# Patient Record
Sex: Female | Born: 1990 | Race: Black or African American | Hispanic: No | Marital: Single | Smoking: Former smoker
Health system: Southern US, Community
[De-identification: ages and names within clinical notes are randomized; demographics above are authoritative.]

## PROBLEM LIST (undated history)

## (undated) ENCOUNTER — Inpatient Hospital Stay (HOSPITAL_COMMUNITY): Payer: Self-pay

## (undated) HISTORY — PX: WRIST SURGERY: SHX841

---

## 2012-03-14 ENCOUNTER — Emergency Department (HOSPITAL_COMMUNITY)
Admission: EM | Admit: 2012-03-14 | Discharge: 2012-03-14 | Disposition: A | Discharge status: Home / Self Care | Payer: Self-pay | Attending: Emergency Medicine | Admitting: Emergency Medicine

## 2012-03-14 ENCOUNTER — Encounter (HOSPITAL_COMMUNITY): Payer: Self-pay | Admitting: *Deleted

## 2012-03-14 DIAGNOSIS — H6120 Impacted cerumen, unspecified ear: Secondary | ICD-10-CM | POA: Insufficient documentation

## 2012-03-14 MED ORDER — CARBAMIDE PEROXIDE 6.5 % OT SOLN: 5.0000 [drp] | Freq: Two times a day (BID) | OTIC | Status: DC

## 2012-03-14 NOTE — ED Notes (Signed)
Pt c/o left ear pain x's 2 days. Also reports "I can't hear" pt states she uses q-tips and noticed blood from left ear this morning.

## 2012-03-14 NOTE — ED Provider Notes (Signed)
History     CSN: [id number]  Arrival date & time 03/14/12  1038   First MD Initiated Contact with Patient 03/14/12 1041      Chief Complaint  Patient presents with  . Otalgia    (Consider location/radiation/quality/duration/timing/severity/associated sxs/prior treatment) HPI  Pt to the ER requesting pregnancy test and her "left ear to be looked at". She was trying to clean it and felt pain. She denies it continuing to hurt. Denies fevers. Denies sore throat, right ear pain, neck pain or any other symptoms at this time. nad vss  History reviewed. No pertinent past medical history.  Past Surgical History  Procedure Date  . Wrist surgery     History reviewed. No pertinent family history.  History  Substance Use Topics  . Smoking status: Not on file  . Smokeless tobacco: Not on file  . Alcohol Use: No    OB History    Grav Para Term Preterm Abortions TAB SAB Ect Mult Living                  Review of Systems  Review of Systems  Gen: no weight loss, fevers, chills, night sweats  Eyes: no discharge or drainage, no occular pain or visual changes  Nose: no epistaxis or rhinorrhea  Mouth: no dental pain, no sore throat  Neck: no neck pain  MSK:  No abnormalities     Allergies  Review of patient's allergies indicates no known allergies.  Home Medications   Current Outpatient Rx  Name  Route  Sig  Dispense  Refill  . OVER THE COUNTER MEDICATION   Oral   Take 1 tablet by mouth daily. Prenatal vitamin         . CARBAMIDE PEROXIDE 6.5 % OT SOLN   Left Ear   Place 5 drops into the left ear 2 (two) times daily.   15 mL   0     BP 125/84  Pulse 70  Temp 98.3 F (36.8 C) (Oral)  Resp 20  Ht 5\' 1"  (1.549 m)  Wt 110 lb (49.896 kg)  BMI 20.78 kg/m2  SpO2 100%  LMP 03/05/2012  Physical Exam  Nursing note and vitals reviewed. Constitutional: She appears well-developed and well-nourished. No distress.  HENT:  Head: Normocephalic and atraumatic.  Right  Ear: Tympanic membrane normal.  Ears:  Eyes: Pupils are equal, round, and reactive to light.  Neck: Normal range of motion. Neck supple.  Cardiovascular: Normal rate and regular rhythm.   Pulmonary/Chest: Effort normal.  Abdominal: Soft.  Neurological: She is alert.  Skin: Skin is warm and dry.    ED Course  Procedures (including critical care time)   Labs Reviewed  POCT PREGNANCY, URINE   No results found.   1. Impacted cerumen       MDM  Pregnancy test negative. Pt given Debrox and told to go to Urgent Care or see PCP.  Pt has been advised of the symptoms that warrant their return to the ED. Patient has voiced understanding and has agreed to follow-up with the PCP or specialist.         Dorthula Matas, PA 03/14/12 1156

## 2012-03-17 NOTE — ED Provider Notes (Signed)
Medical screening examination/treatment/procedure(s) were performed by non-physician practitioner and as supervising physician I was immediately available for consultation/collaboration.   Laray Anger, DO 03/17/12 2018

## 2016-11-14 ENCOUNTER — Encounter (HOSPITAL_COMMUNITY): Payer: Self-pay

## 2016-11-14 ENCOUNTER — Inpatient Hospital Stay (HOSPITAL_COMMUNITY)
Admission: EM | Admit: 2016-11-14 | Discharge: 2016-11-14 | Disposition: A | Discharge status: Home / Self Care | Payer: BLUE CROSS/BLUE SHIELD | Attending: Obstetrics and Gynecology | Admitting: Obstetrics and Gynecology

## 2016-11-14 ENCOUNTER — Inpatient Hospital Stay (HOSPITAL_COMMUNITY): Payer: BLUE CROSS/BLUE SHIELD

## 2016-11-14 DIAGNOSIS — O209 Hemorrhage in early pregnancy, unspecified: Secondary | ICD-10-CM

## 2016-11-14 DIAGNOSIS — O039 Complete or unspecified spontaneous abortion without complication: Secondary | ICD-10-CM | POA: Diagnosis present

## 2016-11-14 DIAGNOSIS — N939 Abnormal uterine and vaginal bleeding, unspecified: Secondary | ICD-10-CM | POA: Diagnosis present

## 2016-11-14 DIAGNOSIS — O034 Incomplete spontaneous abortion without complication: Secondary | ICD-10-CM

## 2016-11-14 LAB — CBC WITH DIFFERENTIAL/PLATELET
BASOS ABS: 0 10*3/uL (ref 0.0–0.1)
Basophils Relative: 0 %
Eosinophils Absolute: 0.1 10*3/uL (ref 0.0–0.7)
Eosinophils Relative: 1 %
HCT: 32.1 % — ABNORMAL LOW (ref 36.0–46.0)
HEMOGLOBIN: 10.6 g/dL — AB (ref 12.0–15.0)
LYMPHS PCT: 18 %
Lymphs Abs: 2.4 10*3/uL (ref 0.7–4.0)
MCH: 27.5 pg (ref 26.0–34.0)
MCHC: 33 g/dL (ref 30.0–36.0)
MCV: 83.4 fL (ref 78.0–100.0)
Monocytes Absolute: 1.1 10*3/uL — ABNORMAL HIGH (ref 0.1–1.0)
Monocytes Relative: 8 %
Neutro Abs: 9.6 10*3/uL — ABNORMAL HIGH (ref 1.7–7.7)
Neutrophils Relative %: 73 %
Platelets: 280 10*3/uL (ref 150–400)
RBC: 3.85 MIL/uL — ABNORMAL LOW (ref 3.87–5.11)
RDW: 14.7 % (ref 11.5–15.5)
WBC: 13.2 10*3/uL — AB (ref 4.0–10.5)

## 2016-11-14 LAB — BASIC METABOLIC PANEL
ANION GAP: 8 (ref 5–15)
BUN: 13 mg/dL (ref 6–20)
CHLORIDE: 107 mmol/L (ref 101–111)
CO2: 24 mmol/L (ref 22–32)
Calcium: 9.5 mg/dL (ref 8.9–10.3)
Creatinine, Ser: 0.71 mg/dL (ref 0.44–1.00)
GFR calc Af Amer: >60 mL/min (ref >60)
GLUCOSE: 128 mg/dL — AB (ref 65–99)
POTASSIUM: 4 mmol/L (ref 3.5–5.1)
Sodium: 139 mmol/L (ref 135–145)

## 2016-11-14 LAB — HCG, QUANTITATIVE, PREGNANCY: hCG, Beta Chain, Quant, S: 5112 m[IU]/mL — ABNORMAL HIGH (ref <5)

## 2016-11-14 LAB — CBC
HCT: 29.9 % — ABNORMAL LOW (ref 36.0–46.0)
Hemoglobin: 9.9 g/dL — ABNORMAL LOW (ref 12.0–15.0)
MCH: 27.6 pg (ref 26.0–34.0)
MCHC: 33.1 g/dL (ref 30.0–36.0)
MCV: 83.3 fL (ref 78.0–100.0)
PLATELETS: 245 10*3/uL (ref 150–400)
RBC: 3.59 MIL/uL — AB (ref 3.87–5.11)
RDW: 15.2 % (ref 11.5–15.5)
WBC: 15.7 10*3/uL — ABNORMAL HIGH (ref 4.0–10.5)

## 2016-11-14 LAB — APTT: aPTT: 28 seconds (ref 24–36)

## 2016-11-14 LAB — PREPARE RBC (CROSSMATCH)

## 2016-11-14 LAB — ABO/RH: ABO/RH(D): O POS

## 2016-11-14 LAB — PROTIME-INR
INR: 1.09
Prothrombin Time: 14.1 seconds (ref 11.4–15.2)

## 2016-11-14 MED ORDER — SODIUM CHLORIDE 0.9 % IV BOLUS (SEPSIS)
1000.0000 mL | Freq: Once | INTRAVENOUS | Status: AC
  Administered 2016-11-14: 1000 mL via INTRAVENOUS

## 2016-11-14 MED ORDER — IBUPROFEN 600 MG PO TABS: 600.0000 mg | ORAL_TABLET | Freq: Four times a day (QID) | ORAL | 0 refills | Status: AC | PRN

## 2016-11-14 MED ORDER — SODIUM CHLORIDE 0.9 % IV SOLN
INTRAVENOUS | Status: DC
  Administered 2016-11-14: 09:00:00 via INTRAVENOUS

## 2016-11-14 MED ORDER — MORPHINE SULFATE (PF) 4 MG/ML IV SOLN
4.0000 mg | Freq: Once | INTRAVENOUS | Status: AC
  Administered 2016-11-14: 4 mg via INTRAVENOUS
  Filled 2016-11-14: qty 1

## 2016-11-14 MED ORDER — MISOPROSTOL 200 MCG PO TABS
800.0000 ug | ORAL_TABLET | Freq: Once | ORAL | Status: AC
  Administered 2016-11-14: 800 ug via ORAL
  Filled 2016-11-14: qty 4

## 2016-11-14 MED ORDER — OXYCODONE-ACETAMINOPHEN 5-325 MG PO TABS: 1.0000 | ORAL_TABLET | ORAL | 0 refills | Status: AC | PRN

## 2016-11-14 NOTE — ED Notes (Addendum)
Pt alert and oriented.  Denies any needs or complaints at this time.  Significant other at bedside.  Verified with Dr. Manus Gunningancour no transfusion at this time.

## 2016-11-14 NOTE — ED Triage Notes (Signed)
Pt reports vaginal bleeding that started yesterday with lower abdominal pain. Pt reports thinking she is having miscarriage. Pt last saw OB/GYN a couple weeks ago, where US was performed.

## 2016-11-14 NOTE — ED Provider Notes (Signed)
[facility]-EMERGENCY DEPT Provider Note   CSN: [id number] Arrival date & time: 11/14/16  0408     History   Chief Complaint Chief Complaint  Patient presents with  . vaginal bleeding/[redacted] weeks pregnant    HPI Cynthia Caldwell is a 26 y.o. female.  Patient presents with pelvic pain and vaginal bleeding. She reports that she believes she is having a miscarriage. She reports that she is about [redacted] weeks pregnant but she also states her last menstrual cycle was in March. She is G2 P1. No previous issues with prior pregnancy. Reports seeing an OB doctor in [place] and have an ultrasound. she states she developed vaginal bleeding last night which became more severe this morning. She has gone through 3 pads this morning. She also has lower pelvic cramping. No back pain. No fever. No vomiting. No pain with urination or blood in the urine. She does not know her blood type. Denies any dizziness or lightheadedness.   The history is provided by the patient.    History reviewed. No pertinent past medical history.  There are no active problems to display for this patient.   Past Surgical History:  Procedure Laterality Date  . CESAREAN SECTION    . WRIST SURGERY      OB History    Gravida Para Term Preterm AB Living   2 1 1     1    SAB TAB Ectopic Multiple Live Births                   Home Medications    Prior to Admission medications   Medication Sig Start Date End Date Taking? Authorizing Provider  carbamide peroxide (DEBROX) 6.5 % otic solution Place 5 drops into the left ear 2 (two) times daily. 03/14/12   Marlon Pel, PA-C  OVER THE COUNTER MEDICATION Take 1 tablet by mouth daily. Prenatal vitamin    [provider]    Family History No family history on file.  Social History Social History  Substance Use Topics  . Smoking status: Former Smoker    Years: 5.00  . Smokeless tobacco: Never Used  . Alcohol use No     Allergies   Patient has no known  allergies.   Review of Systems Review of Systems  Constitutional: Negative for activity change, appetite change and fever.  Respiratory: Negative for cough, chest tightness and shortness of breath.   Gastrointestinal: Positive for abdominal pain. Negative for nausea and vomiting.  Genitourinary: Positive for pelvic pain and vaginal bleeding. Negative for dysuria, flank pain and hematuria.  Musculoskeletal: Negative for arthralgias, back pain, gait problem and myalgias.  Neurological: Negative for dizziness, weakness, light-headedness and headaches.    all other systems are negative except as noted in the HPI and PMH.    Physical Exam Updated Vital Signs BP (!) 132/93   Pulse 90   Temp 97.7 F (36.5 C) (Oral)   Resp 20   Ht 5\' 1"  (1.549 m)   LMP 07/21/2016 (Approximate)   SpO2 100%   Physical Exam  Constitutional: She is oriented to person, place, and time. She appears well-developed and well-nourished. No distress.  HENT:  Head: Normocephalic and atraumatic.  Mouth/Throat: Oropharynx is clear and moist. No oropharyngeal exudate.  Eyes: Conjunctivae and EOM are normal. Pupils are equal, round, and reactive to light.  Neck: Normal range of motion. Neck supple.  No meningismus.  Cardiovascular: Normal rate, regular rhythm, normal heart sounds and intact distal pulses.   No murmur  heard. Pulmonary/Chest: Effort normal and breath sounds normal. No respiratory distress.  Abdominal: Soft. There is tenderness. There is no rebound and no guarding.  Gravid abdomen.  Suprapubic tenderness  Genitourinary:  Genitourinary Comments: Chaperone present. Normal external genitalia. Large amount of blood and clot in the vagina. Cervix dilated about 2 cm. Central uterine tenderness, no lateralizing adnexal tenderness  Musculoskeletal: Normal range of motion. She exhibits no edema or tenderness.  Neurological: She is alert and oriented to person, place, and time. No cranial nerve deficit. She  exhibits normal muscle tone. Coordination normal.   5/5 strength throughout. CN 2-12 intact.Equal grip strength.   Skin: Skin is warm.  Psychiatric: She has a normal mood and affect. Her behavior is normal.  Nursing note and vitals reviewed.    ED Treatments / Results  Labs (all labs ordered are listed, but only abnormal results are displayed) Labs Reviewed  CBC WITH DIFFERENTIAL/PLATELET - Abnormal; Notable for the following:       Result Value   WBC 13.2 (*)    RBC 3.85 (*)    Hemoglobin 10.6 (*)    HCT 32.1 (*)    Neutro Abs 9.6 (*)    Monocytes Absolute 1.1 (*)    All other components within normal limits  BASIC METABOLIC PANEL - Abnormal; Notable for the following:    Glucose, Bld 128 (*)    All other components within normal limits  HCG, QUANTITATIVE, PREGNANCY - Abnormal; Notable for the following:    hCG, Beta Chain, Quant, S 5,112 (*)    All other components within normal limits  PROTIME-INR  APTT  URINALYSIS, ROUTINE W REFLEX MICROSCOPIC  TYPE AND SCREEN  PREPARE RBC (CROSSMATCH)  ABO/RH    EKG  EKG Interpretation None       Radiology No results found.  Procedures Procedures (including critical care time)  Medications Ordered in ED Medications - No data to display   Initial Impression / Assessment and Plan / ED Course  I have reviewed the triage vital signs and the nursing notes.  Pertinent labs & imaging results that were available during my care of the patient were reviewed by me and considered in my medical decision making (see chart for details).    Patient with vaginal bleeding and suspicion for miscarriage. Pelvic pain.   Per medical record, she had ultrasound done at Dr. Ophelia Charter office on May 18. "On [place], only small GS seen. No YS/FP".  IV access established, blood work obtained, pelvic exam performed.  Patient with large amount of clot on pelvic exam with abnormal uterus on ultrasound. No IUP seen.  Discussed with nurse for Dr. Chestine Spore  who is in delivery.  Patient had near syncopal episode while attending to stand up. She is given IV fluids. Blood work sent including type and screen.  Blood pressure has improved with fluids and she is no longer symptomatic. Heart rate is in the 70s and 80s. Hemoglobin 10.6 without comparison.  Discussed with Dr. Chestine Spore of OB. She agrees with transfer to the MAU for evaluation for heavy bleeding and near syncope. Recommends withholding blood transfusion at this time unless she becomes hypotensive again.  Pressure 110/76. Heart rate 89. Patient is awake and alert and stable for transfer.   EMERGENCY DEPARTMENT [place] PREGNANCY "Study: Limited Ultrasound of the Pelvis for Pregnancy"  INDICATIONS:Pregnancy(required), Vaginal bleeding and Abdominal or pelvic pain Multiple views of the uterus and pelvic cavity were obtained in real-time with a multi-frequency probe.  APPROACH:Transabdominal  PERFORMED BY:  Myself IMAGES ARCHIVED?: Yes LIMITATIONS: Emergent procedure and pain PREGNANCY FREE FLUID: None ADNEXAL FINDINGS:Left ovary not seen and Right ovary not seen GESTATIONAL AGE, ESTIMATE:  FETAL HEART RATE:  INTERPRETATION: no IUP seen.  Uterus full of clot and blood.   CRITICAL CARE Performed by: Glynn OctaveANCOUR, Yajahira Tison Total critical care time: 45 minutes Critical care time was exclusive of separately billable procedures and treating other patients. Critical care was necessary to treat or prevent imminent or life-threatening deterioration. Critical care was time spent personally by me on the following activities: development of treatment plan with patient and/or surrogate as well as nursing, discussions with consultants, evaluation of patient's response to treatment, examination of patient, obtaining history from patient or surrogate, ordering and performing treatments and interventions, ordering and review of laboratory studies, ordering and review of radiographic studies, pulse oximetry and  re-evaluation of patient's condition.    Final Clinical Impressions(s) / ED Diagnoses   Final diagnoses:  Inevitable abortion  Vaginal bleeding    New Prescriptions New Prescriptions   No medications on file     Glynn Octaveancour, Lashauna Arpin, MD 11/14/16 [phone]

## 2016-11-14 NOTE — ED Notes (Signed)
Pt ambulatory to bathroom, given peri-pad, and urine cup.

## 2016-11-14 NOTE — MAU Note (Signed)
Pt. States that she started bleeding yesterday a small amount and woke up this morning at 3 am having a larger amount of bleeding with quarter sized clots. Pt. Has had pain and cramping in lower abdominal area.

## 2016-11-14 NOTE — ED Notes (Signed)
Report given to Justin with [facility] 

## 2016-11-14 NOTE — MAU Provider Note (Signed)
History     CSN: [id number]  Arrival date and time: 11/14/16 0825   First Provider Initiated Contact with Patient 11/14/16 1122      Chief Complaint  Patient presents with  . vaginal bleeding/[redacted] weeks pregnant   HPI   Ms.Cynthia Caldwell is a 26 y.o. female G2P1001 here in MAU as a transfer from [facility] for possible D&C per Dr. Clark. She presented to [facility] with abdominal pain and vaginal bleeding. She presented there early this morning with concerns about miscarriage. Says her LMP was in March and she believes she is about [redacted] weeks pregnant. She had one visit with Dr. Clark at [facility] and did not go back for follow up. The bleeding started last night and became heavy like a menstrual cycle this morning.   OB History    Gravida Para Term Preterm AB Living   2 1 1     1   SAB TAB Ectopic Multiple Live Births                  History reviewed. No pertinent past medical history.  Past Surgical History:  Procedure Laterality Date  . CESAREAN SECTION    . WRIST SURGERY      History reviewed. No pertinent family history.  Social History  Substance Use Topics  . Smoking status: Former Smoker    Years: 5.00  . Smokeless tobacco: Never Used  . Alcohol use No    Allergies: No Known Allergies  Prescriptions Prior to Admission  Medication Sig Dispense Refill Last Dose  . Prenatal Vit-Fe Fumarate-FA (PRENATAL MULTIVITAMIN) TABS tablet Take 1 tablet by mouth daily at 12 noon.   11/13/2016 at Unknown time   Results for orders placed or performed during the hospital encounter of 11/14/16 (from the past 48 hour(s))  CBC with Differential/Platelet     Status: Abnormal   Collection Time: 11/14/16  6:07 AM  Result Value Ref Range   WBC 13.2 (H) 4.0 - 10.5 K/uL   RBC 3.85 (L) 3.87 - 5.11 MIL/uL   Hemoglobin 10.6 (L) 12.0 - 15.0 g/dL   HCT 32.1 (L) 36.0 - 46.0 %   MCV 83.4 78.0 - 100.0 fL   MCH 27.5 26.0 - 34.0 pg   MCHC 33.0 30.0 - 36.0 g/dL   RDW  14.7 11.5 - 15.5 %   Platelets 280 150 - 400 K/uL   Neutrophils Relative % 73 %   Neutro Abs 9.6 (H) 1.7 - 7.7 K/uL   Lymphocytes Relative 18 %   Lymphs Abs 2.4 0.7 - 4.0 K/uL   Monocytes Relative 8 %   Monocytes Absolute 1.1 (H) 0.1 - 1.0 K/uL   Eosinophils Relative 1 %   Eosinophils Absolute 0.1 0.0 - 0.7 K/uL   Basophils Relative 0 %   Basophils Absolute 0.0 0.0 - 0.1 K/uL  Basic metabolic panel     Status: Abnormal   Collection Time: 11/14/16  6:07 AM  Result Value Ref Range   Sodium 139 135 - 145 mmol/L   Potassium 4.0 3.5 - 5.1 mmol/L   Chloride 107 101 - 111 mmol/L   CO2 24 22 - 32 mmol/L   Glucose, Bld 128 (H) 65 - 99 mg/dL   BUN 13 6 - 20 mg/dL   Creatinine, Ser 0.71 0.44 - 1.00 mg/dL   Calcium 9.5 8.9 - 10.3 mg/dL   GFR calc non Af Amer >60 >60 mL/min   GFR calc Af Amer >60 >  60 mL/min    Comment: (NOTE) The eGFR has been calculated using the CKD EPI equation. This calculation has not been validated in all clinical situations. eGFR's persistently <60 mL/min signify possible Chronic Kidney Disease.    Anion gap 8 5 - 15  hCG, quantitative, pregnancy     Status: Abnormal   Collection Time: 11/14/16  6:08 AM  Result Value Ref Range   hCG, Beta Chain, Quant, S 5,112 (H) <5 mIU/mL    Comment:          GEST. AGE      CONC.  (mIU/mL)   <=1 WEEK        5 - 50     2 WEEKS       50 - 500     3 WEEKS       100 - 10,000     4 WEEKS     1,000 - 30,000     5 WEEKS     3,500 - 115,000   6-8 WEEKS     12,000 - 270,000    12 WEEKS     15,000 - 220,000        FEMALE AND NON-PREGNANT FEMALE:     LESS THAN 5 mIU/mL   Type and screen [facility]     Status: None (Preliminary result)   Collection Time: 11/14/16  6:08 AM  Result Value Ref Range   ABO/RH(D) O POS    Antibody Screen NEG    Sample Expiration 11/17/2016    Unit Number [id number]    Blood Component Type RED CELLS,LR    Unit division 00    Status of Unit ALLOCATED    Transfusion Status OK TO TRANSFUSE     Crossmatch Result Compatible    Unit Number [id number]    Blood Component Type RBC LR PHER1    Unit division 00    Status of Unit ALLOCATED    Transfusion Status OK TO TRANSFUSE    Crossmatch Result Compatible   Protime-INR     Status: None   Collection Time: 11/14/16  6:08 AM  Result Value Ref Range   Prothrombin Time 14.1 11.4 - 15.2 seconds   INR 1.09   APTT     Status: None   Collection Time: 11/14/16  6:08 AM  Result Value Ref Range   aPTT 28 24 - 36 seconds  Prepare RBC     Status: None   Collection Time: 11/14/16  6:08 AM  Result Value Ref Range   Order Confirmation ORDER PROCESSED BY BLOOD BANK   ABO/Rh     Status: None   Collection Time: 11/14/16  6:08 AM  Result Value Ref Range   ABO/RH(D) O POS   CBC     Status: Abnormal   Collection Time: 11/14/16  9:23 AM  Result Value Ref Range   WBC 15.7 (H) 4.0 - 10.5 K/uL   RBC 3.59 (L) 3.87 - 5.11 MIL/uL   Hemoglobin 9.9 (L) 12.0 - 15.0 g/dL   HCT 29.9 (L) 36.0 - 46.0 %   MCV 83.3 78.0 - 100.0 fL   MCH 27.6 26.0 - 34.0 pg   MCHC 33.1 30.0 - 36.0 g/dL   RDW 15.2 11.5 - 15.5 %   Platelets 245 150 - 400 K/uL    US Ob Comp Less 14 Wks  Result Date: 11/14/2016 CLINICAL DATA:  Vaginal bleeding for 1 day EXAM: OBSTETRIC <14 WK [place] AND TRANSVAGINAL OB US TECHNIQUE: Both transabdominal and transvaginal  ultrasound examinations were performed for complete evaluation of the gestation as well as the maternal uterus, adnexal regions, and pelvic cul-de-sac. Transvaginal technique was performed to assess early pregnancy. COMPARISON:  None. FINDINGS: Intrauterine gestational sac: None visualized Yolk sac:  Not visualized Embryo:  Not visualized Cardiac Activity: Heart Rate:   bpm MSD:   mm    w     d CRL:    mm    w    d                  US EDC: Subchorionic hemorrhage:  None visualized. Maternal uterus/adnexae: No adnexal mass or free fluid. Heterogeneous, complex fluid or appearance of the endometrium, likely blood products in the lower  uterine segment/ cervical region. Endometrium measures 12 mm. IMPRESSION: No intrauterine pregnancy visualized. There is complex appearance of the endometrium in the lower uterine segment/cervical region which may be related to blood products. Electronically Signed   By: Kevin  Dover M.D.   On: 11/14/2016 10:18   Us Ob Transvaginal  Result Date: 11/14/2016 CLINICAL DATA:  Vaginal bleeding for 1 day EXAM: OBSTETRIC <14 WK US AND TRANSVAGINAL OB US TECHNIQUE: Both transabdominal and transvaginal ultrasound examinations were performed for complete evaluation of the gestation as well as the maternal uterus, adnexal regions, and pelvic cul-de-sac. Transvaginal technique was performed to assess early pregnancy. COMPARISON:  None. FINDINGS: Intrauterine gestational sac: None visualized Yolk sac:  Not visualized Embryo:  Not visualized Cardiac Activity: Heart Rate:   bpm MSD:   mm    w     d CRL:    mm    w    d                  US EDC: Subchorionic hemorrhage:  None visualized. Maternal uterus/adnexae: No adnexal mass or free fluid. Heterogeneous, complex fluid or appearance of the endometrium, likely blood products in the lower uterine segment/ cervical region. Endometrium measures 12 mm. IMPRESSION: No intrauterine pregnancy visualized. There is complex appearance of the endometrium in the lower uterine segment/cervical region which may be related to blood products. Electronically Signed   By: Kevin  Dover M.D.   On: 11/14/2016 10:18   Review of Systems  Gastrointestinal: Negative for abdominal pain.  Genitourinary: Positive for vaginal bleeding.  Neurological: Negative for dizziness and weakness.   Physical Exam   Blood pressure 109/86, pulse 82, temperature 98.4 F (36.9 C), temperature source Oral, resp. rate 18, height 5' 1" (1.549 m), last menstrual period 07/17/2016, SpO2 100 %.  Physical Exam  Constitutional: She is oriented to person, place, and time. She appears well-developed and well-nourished.  No distress.  HENT:  Head: Normocephalic.  Eyes: Pupils are equal, round, and reactive to light.  Respiratory: Effort normal.  Genitourinary:  Genitourinary Comments: Vagina - Small amount of dark red blood noted in the  vaginal canal. 2 large swabs used  Cervix - +active bleeding; small trickle  Bimanual exam: Cervix closed Uterus non tender, enlarged  Adnexa non tender, no masses bilaterally Chaperone present for exam.   Musculoskeletal: Normal range of motion.  Neurological: She is alert and oriented to person, place, and time.  Skin: Skin is warm and dry. She is not diaphoretic. There is pallor.  Psychiatric: Her behavior is normal.    MAU Course  Procedures  None   MDM  Hgb @ 0600: 10.6 Hgb @ 0900: 9.9 Pain 0/10 @ 11:15 O positive blood type. Orthostatic vitals WNL US showing complex   appearance in lower uterine segment Pelvic exam for reevaluation.  Discussed patient and exam with Dr. Harrington Challenger.  Patient stable at this time. Vaginal bleeding minimal. Patient up without dizziness. D&C not imperative at this time. Discussed D&C Vs Cytotec with patient who strongly urges to avoid surgery. Patient agreeable to oral Cytotec with close follow up in the office in 1 week.  Cytotec 800 mg given Buccal here in MAU. Patient tolerated well.   1250: reevaluated patient prior to DC home. Bleeding minimal. Small, quarter size on pad. Stable to DC home.   Assessment and Plan   A:  1. Inevitable abortion   2. Vaginal bleeding in pregnancy, first trimester   3. SAB (spontaneous abortion)     P:  Discharge home in stable condition Call the office today to schedule a follow up Quant and reevaluation in 1 week Bleeding precautions Strict return precautions Discussed the purpose of Cytotec, what to expect. Reasons to return Rx: Percocet, Ibuprofen Support given   Noni Saupe I, NP 11/14/2016 3:03 PM

## 2016-11-14 NOTE — Discharge Instructions (Signed)
Miscarriage A miscarriage is the sudden loss of an unborn baby (fetus) before the 20th week of pregnancy. Most miscarriages happen in the first 3 months of pregnancy. Sometimes, it happens before a woman even knows she is pregnant. A miscarriage is also called a "spontaneous miscarriage" or "early pregnancy loss." Having a miscarriage can be an emotional experience. Talk with your caregiver about any questions you may have about miscarrying, the grieving process, and your future pregnancy plans. What are the causes?  Problems with the fetal chromosomes that make it impossible for the baby to develop normally. Problems with the baby's genes or chromosomes are most often the result of errors that occur, by chance, as the embryo divides and grows. The problems are not inherited from the parents.  Infection of the cervix or uterus.  Hormone problems.  Problems with the cervix, such as having an incompetent cervix. This is when the tissue in the cervix is not strong enough to hold the pregnancy.  Problems with the uterus, such as an abnormally shaped uterus, uterine fibroids, or congenital abnormalities.  Certain medical conditions.  Smoking, drinking alcohol, or taking illegal drugs.  Trauma. Often, the cause of a miscarriage is unknown. What are the signs or symptoms?  Vaginal bleeding or spotting, with or without cramps or pain.  Pain or cramping in the abdomen or lower back.  Passing fluid, tissue, or blood clots from the vagina. How is this diagnosed? Your caregiver will perform a physical exam. You may also have an ultrasound to confirm the miscarriage. Blood or urine tests may also be ordered. How is this treated?  Sometimes, treatment is not necessary if you naturally pass all the fetal tissue that was in the uterus. If some of the fetus or placenta remains in the body (incomplete miscarriage), tissue left behind may become infected and must be removed. Usually, a dilation and  curettage (D and C) procedure is performed. During a D and C procedure, the cervix is widened (dilated) and any remaining fetal or placental tissue is gently removed from the uterus.  Antibiotic medicines are prescribed if there is an infection. Other medicines may be given to reduce the size of the uterus (contract) if there is a lot of bleeding.  If you have Rh negative blood and your baby was Rh positive, you will need a Rh immunoglobulin shot. This shot will protect any future baby from having Rh blood problems in future pregnancies. Follow these instructions at home:  Your caregiver may order bed rest or may allow you to continue light activity. Resume activity as directed by your caregiver.  Have someone help with home and family responsibilities during this time.  Keep track of the number of sanitary pads you use each day and how soaked (saturated) they are. Write down this information.  Do not use tampons. Do not douche or have sexual intercourse until approved by your caregiver.  Only take over-the-counter or prescription medicines for pain or discomfort as directed by your caregiver.  Do not take aspirin. Aspirin can cause bleeding.  Keep all follow-up appointments with your caregiver.  If you or your partner have problems with grieving, talk to your caregiver or seek counseling to help cope with the pregnancy loss. Allow enough time to grieve before trying to get pregnant again. Get help right away if:  You have severe cramps or pain in your back or abdomen.  You have a fever.  You pass large blood clots (walnut-sized or larger) ortissue from your  vagina. Save any tissue for your caregiver to inspect. °· Your bleeding increases. °· You have a thick, bad-smelling vaginal discharge. °· You become lightheaded, weak, or you faint. °· You have chills. °This information is not intended to replace advice given to you by your health care provider. Make sure you discuss any questions  you have with your health care provider. °Document Released: 10/19/2000 Document Revised: 10/01/2015 Document Reviewed: 06/14/2011 °Elsevier Interactive Patient Education © 2017 [employer] ° ° °Pelvic Rest °Pelvic rest may be recommended if: °· Your placenta is partially or completely covering the opening of your cervix (placenta previa). °· There is bleeding between the wall of the uterus and the amniotic sac in the first trimester of pregnancy (subchorionic hemorrhage). °· You went into labor too early (preterm labor). ° °Based on your overall health and the health of your baby, your health care provider will decide if pelvic rest is right for you. °How do I rest my pelvis? °For as long as told by your health care provider: °· Do not have sex, sexual stimulation, or an orgasm. °· Do not use tampons. Do not douche. Do not put anything in your vagina. °· Do not lift anything that is heavier than 10 lb (4.5 kg). °· Avoid activities that take a lot of effort (are strenuous). °· Avoid any activity in which your pelvic muscles could become strained. ° °When should I seek medical care? °Seek medical care if you have: °· Cramping pain in your lower abdomen. °· Vaginal discharge. °· A low, dull backache. °· Regular contractions. °· Uterine tightening. ° °When should I seek immediate medical care? °Seek immediate medical care if: °· You have vaginal bleeding and you are pregnant. ° °This information is not intended to replace advice given to you by your health care provider. Make sure you discuss any questions you have with your health care provider. °Document Released: 08/20/2010 Document Revised: 10/01/2015 Document Reviewed: 10/27/2014 °Elsevier Interactive Patient Education © 2018 [employer] ° °

## 2016-11-17 LAB — BPAM RBC
Blood Product Expiration Date: 201807282359
Blood Product Expiration Date: 201808082359
UNIT TYPE AND RH: 5100
UNIT TYPE AND RH: 9500

## 2016-11-17 LAB — TYPE AND SCREEN
ABO/RH(D): O POS
Antibody Screen: NEGATIVE
UNIT DIVISION: 0
Unit division: 0

## 2017-07-16 ENCOUNTER — Encounter (HOSPITAL_COMMUNITY): Payer: Self-pay

## 2018-11-10 IMAGING — US US OB COMP LESS 14 WK
1 series · 15 of 28 positions shown · non-contrast
Comparison: None.

CLINICAL DATA: Vaginal bleeding for 1 day

EXAM:
OBSTETRIC <14 WK US AND TRANSVAGINAL OB US
TECHNIQUE: Both transabdominal and transvaginal ultrasound examinations were
performed for complete evaluation of the gestation as well as the
maternal uterus, adnexal regions, and pelvic cul-de-sac.
Transvaginal technique was performed to assess early pregnancy.

[Series 1: us ob comp less 14 wk · 15 of 59 slices shown]
[im 1/59]
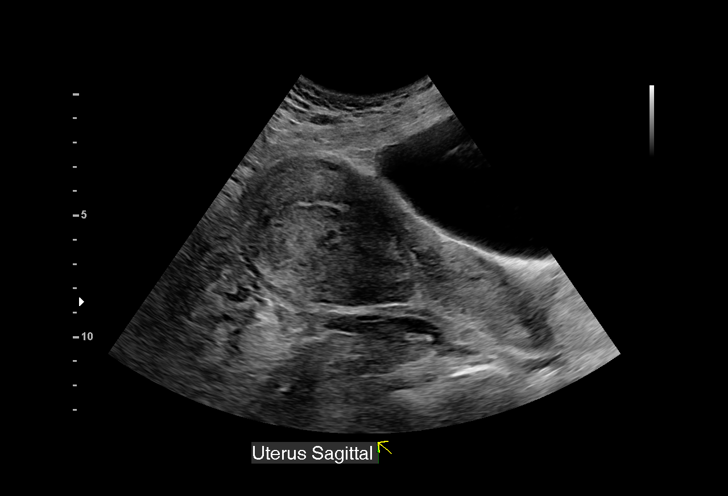
[im 5/59]
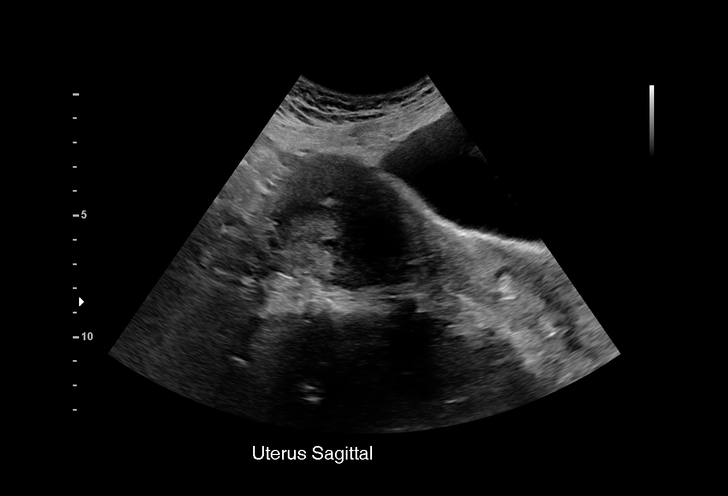
[im 9/59]
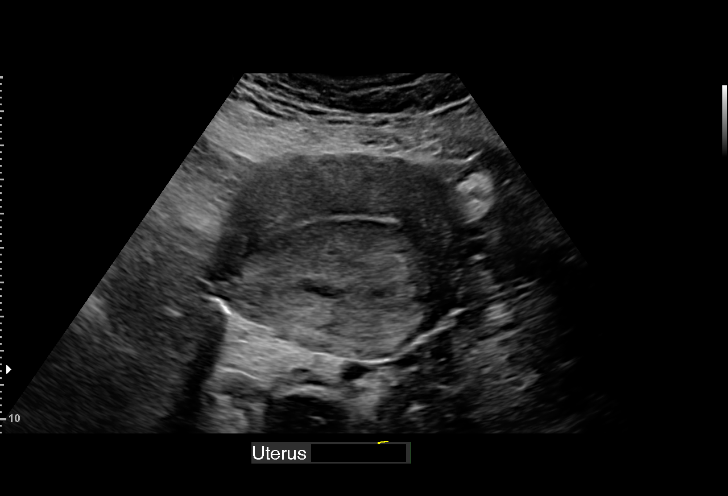
[im 13/59]
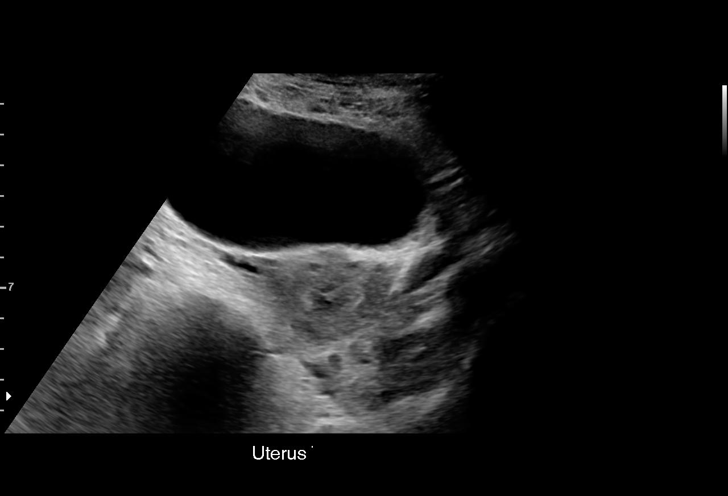
[im 18/59]
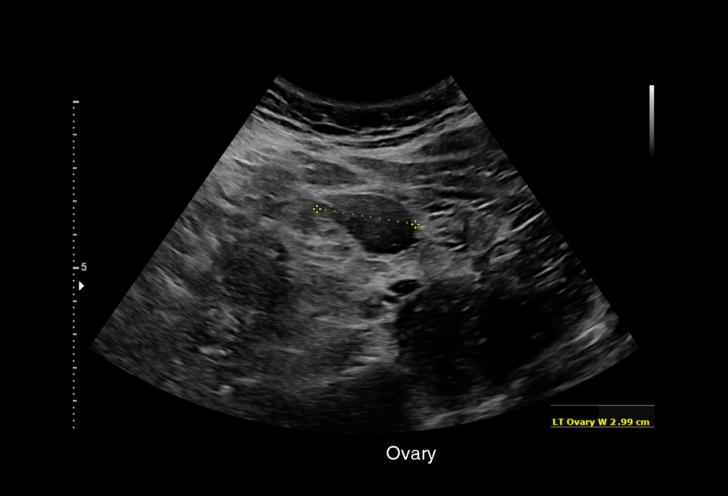
[im 22/59]
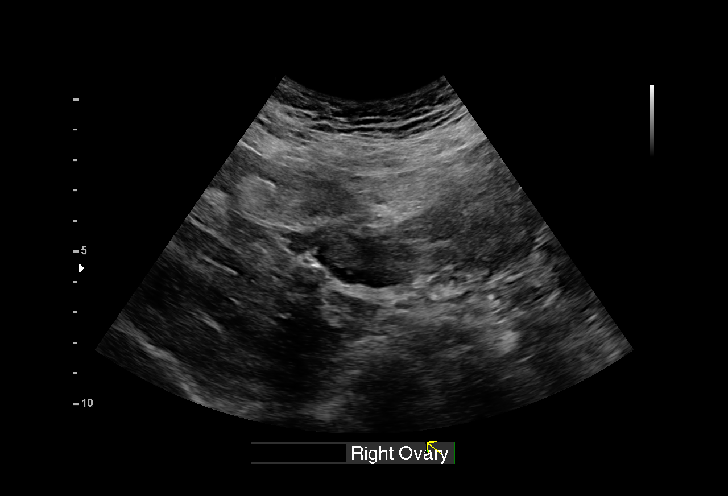
[im 26/59]
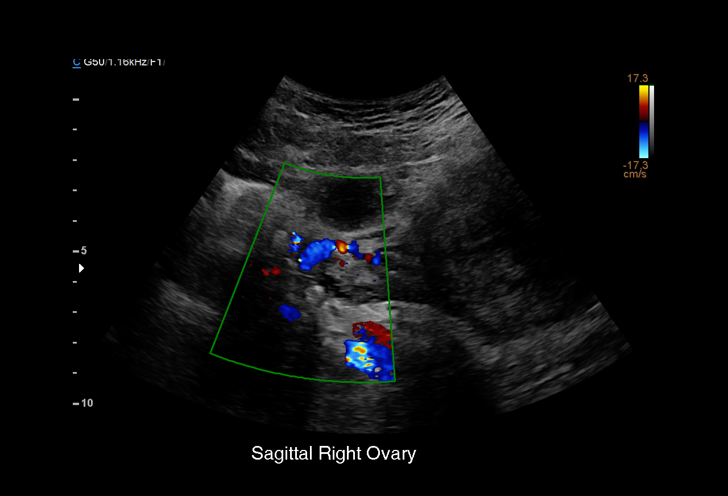
[im 31/59]
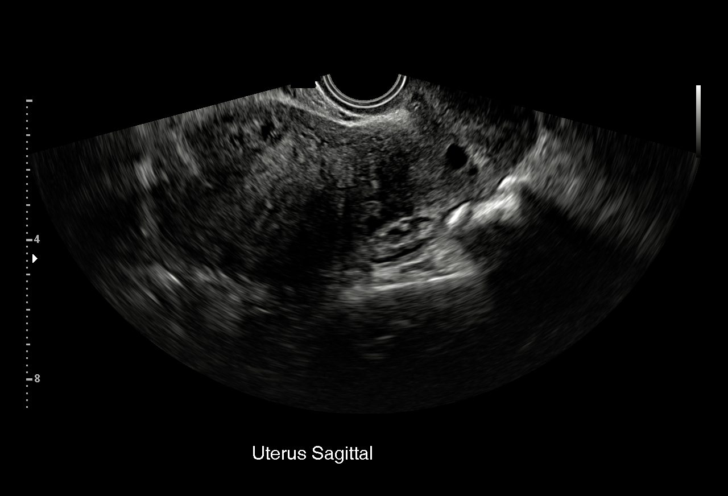
[im 33/59]
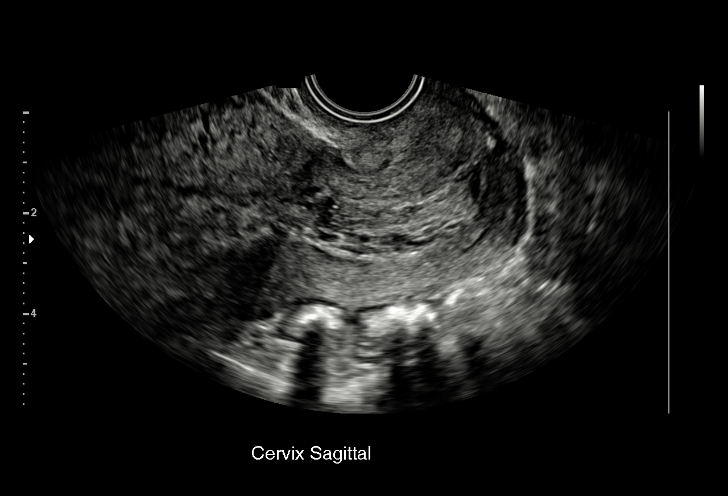
[im 37/59]
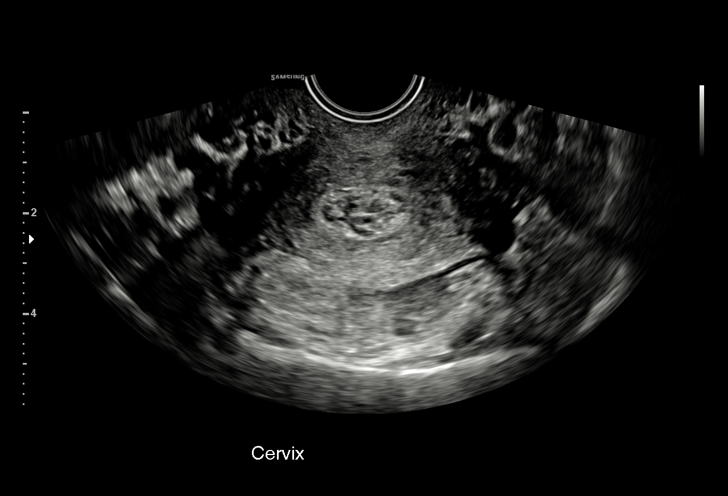
[im 41/59]
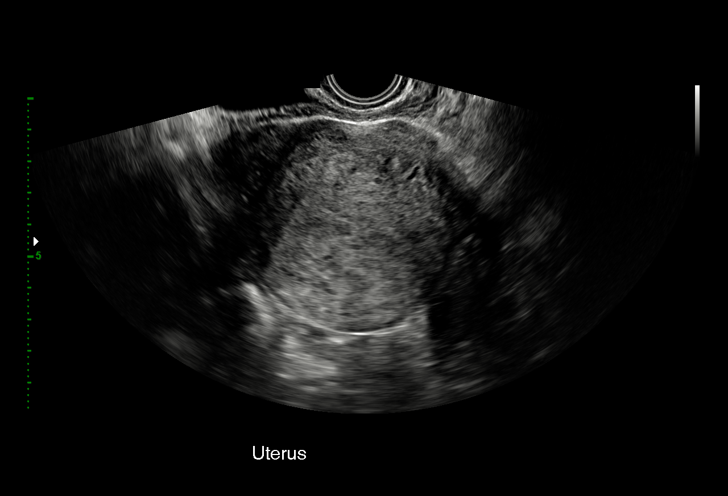
[im 46/59]
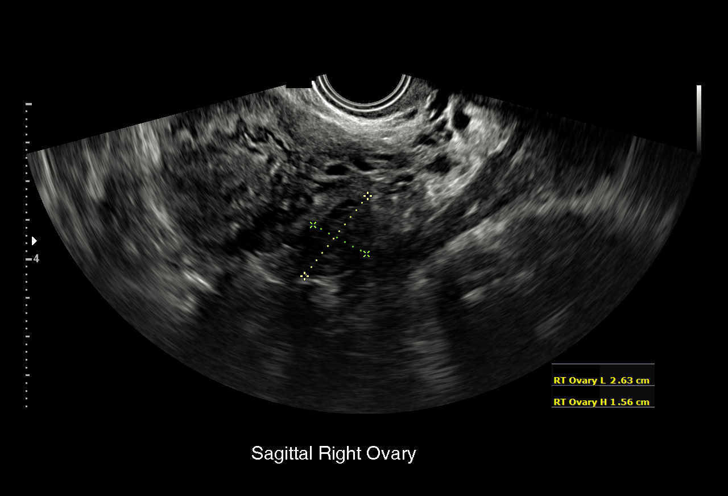
[im 50/59]
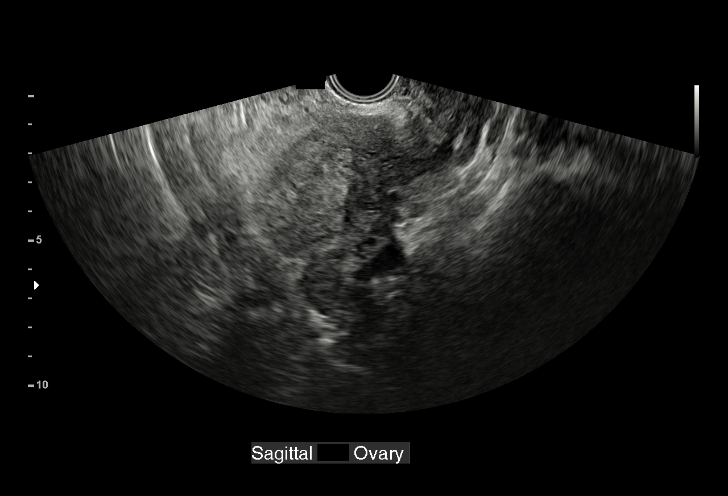
[im 54/59]
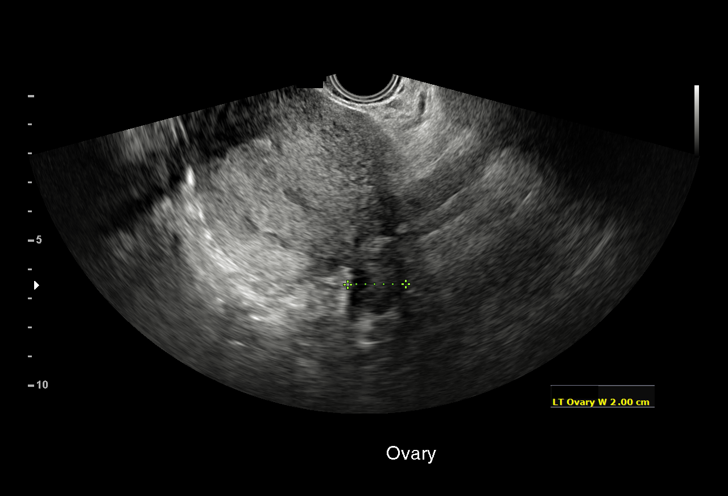
[im 59/59]
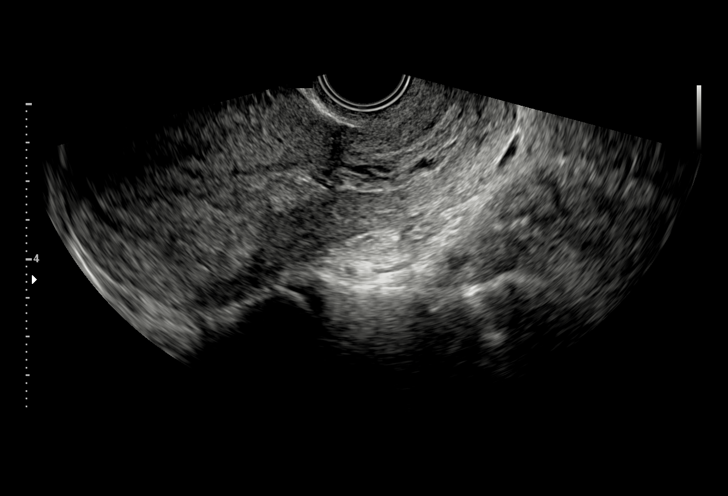

[15 of 28 positions shown; findings below may reference images not displayed]

FINDINGS: Intrauterine gestational sac: None visualized

Yolk sac:  Not visualized

Embryo:  Not visualized

Cardiac Activity:

Heart Rate:   bpm

MSD:   mm    w     d

CRL:    mm    w    d                  US EDC:

Subchorionic hemorrhage:  None visualized.

Maternal uterus/adnexae: No adnexal mass or free fluid.
Heterogeneous, complex fluid or appearance of the endometrium,
likely blood products in the lower uterine segment/ cervical region.
Endometrium measures 12 mm.
IMPRESSION: No intrauterine pregnancy visualized. There is complex appearance of
the endometrium in the lower uterine segment/cervical region which
may be related to blood products.

## 2021-02-07 ENCOUNTER — Other Ambulatory Visit: Payer: Self-pay

## 2021-02-07 ENCOUNTER — Emergency Department (HOSPITAL_COMMUNITY)
Admission: EM | Admit: 2021-02-07 | Discharge: 2021-02-07 | Disposition: A | Discharge status: Home / Self Care | Payer: BLUE CROSS/BLUE SHIELD | Attending: Emergency Medicine | Admitting: Emergency Medicine

## 2021-02-07 ENCOUNTER — Encounter (HOSPITAL_COMMUNITY): Payer: Self-pay | Admitting: Emergency Medicine

## 2021-02-07 DIAGNOSIS — Z87891 Personal history of nicotine dependence: Secondary | ICD-10-CM | POA: Insufficient documentation

## 2021-02-07 DIAGNOSIS — N61 Mastitis without abscess: Secondary | ICD-10-CM | POA: Insufficient documentation

## 2021-02-07 MED ORDER — CEPHALEXIN 500 MG PO CAPS: 500.0000 mg | ORAL_CAPSULE | Freq: Four times a day (QID) | ORAL | 0 refills | Status: AC

## 2021-02-07 NOTE — ED Provider Notes (Signed)
[facility] EMERGENCY DEPARTMENT Provider Note   CSN: [id number] Arrival date & time: 02/07/21  1152     History No chief complaint on file.   Cynthia Caldwell How is a 30 y.o. female who presents with concern for drainage that is yellow and bloody from the left breast skin this morning in the shower. No pain, fevers, chills, or hx of the same.  She is not currently breast-feeding.  I personally reviewed this patient's medical records.  She does not have any family history of breast cancer, no history of skin infections and is not on any medications every day.  HPI     History reviewed. No pertinent past medical history.  There are no problems to display for this patient.   Past Surgical History:  Procedure Laterality Date   CESAREAN SECTION     WRIST SURGERY       OB History     Gravida  2   Para  1   Term  1   Preterm      AB      Living  1      SAB      IAB      Ectopic      Multiple      Live Births              No family history on file.  Social History   Tobacco Use   Smoking status: Former    Years: 5.00    Types: Cigarettes   Smokeless tobacco: Never  Vaping Use   Vaping Use: Never used  Substance Use Topics   Alcohol use: No   Drug use: No    Home Medications Prior to Admission medications   Medication Sig Start Date End Date Taking? Authorizing Provider  cephALEXin (KEFLEX) 500 MG capsule Take 1 capsule (500 mg total) by mouth 4 (four) times daily. 02/07/21  Yes Vonzella Althaus R, PA-C  ibuprofen (ADVIL,MOTRIN) 600 MG tablet Take 1 tablet (600 mg total) by mouth every 6 (six) hours as needed. 11/14/16   Rasch, Victorino Dike I, NP  oxyCODONE-acetaminophen (PERCOCET/ROXICET) 5-325 MG tablet Take 1-2 tablets by mouth every 4 (four) hours as needed for severe pain. 11/14/16   Rasch, Harolyn Rutherford, NP  Prenatal Vit-Fe Fumarate-FA (PRENATAL MULTIVITAMIN) TABS tablet Take 1 tablet by mouth daily at 12 noon.    [provider]    Allergies    Patient has no known allergies.  Review of Systems   Review of Systems  Constitutional: Negative.   HENT: Negative.    Respiratory: Negative.    Cardiovascular: Negative.   Gastrointestinal: Negative.   Genitourinary: Negative.   Musculoskeletal: Negative.   Skin:  Positive for wound.  Neurological: Negative.    Physical Exam Updated Vital Signs BP 139/81 (BP Location: Left Arm)   Pulse (!) 108   Temp 98.5 F (36.9 C) (Oral)   Resp 14   LMP 02/05/2021   SpO2 100%   Physical Exam Vitals and nursing note reviewed.  Constitutional:      Appearance: She is not ill-appearing or toxic-appearing.  HENT:     Head: Normocephalic and atraumatic.     Nose: Nose normal. No congestion.     Mouth/Throat:     Mouth: Mucous membranes are moist.     Pharynx: Oropharynx is clear. Uvula midline. No oropharyngeal exudate or posterior oropharyngeal erythema.     Tonsils: No tonsillar exudate.  Eyes:     General:  Lids are normal. Vision grossly intact.        Right eye: No discharge.        Left eye: No discharge.     Extraocular Movements: Extraocular movements intact.     Conjunctiva/sclera: Conjunctivae normal.     Pupils: Pupils are equal, round, and reactive to light.  Neck:     Trachea: Trachea and phonation normal.     Meningeal: Brudzinski's sign and Kernig's sign absent.  Cardiovascular:     Rate and Rhythm: Normal rate and regular rhythm.     Pulses: Normal pulses.     Heart sounds: Normal heart sounds. No murmur heard. Pulmonary:     Effort: Pulmonary effort is normal. No tachypnea, bradypnea, accessory muscle usage, prolonged expiration or respiratory distress.     Breath sounds: Normal breath sounds. No wheezing or rales.  Chest:     Chest wall: No mass, lacerations, deformity, swelling, tenderness, crepitus or edema.       Comments: Bedside ultrasound used to assess for underlying abscess which did reveal a very small amount of  residual pus beneath the skin; given scant amount and opening for drainage, as well as location on the breast itself, do not feel I&D in the ED is appropriate. Abdominal:     General: Bowel sounds are normal. There is no distension.     Palpations: Abdomen is soft.     Tenderness: There is no abdominal tenderness. There is no right CVA tenderness, left CVA tenderness, guarding or rebound.  Musculoskeletal:        General: No deformity.     Cervical back: Normal range of motion and neck supple. No edema, rigidity or crepitus. No pain with movement, spinous process tenderness or muscular tenderness.     Right lower leg: No edema.     Left lower leg: No edema.  Lymphadenopathy:     Cervical: No cervical adenopathy.  Skin:    General: Skin is warm and dry.     Capillary Refill: Capillary refill takes less than 2 seconds.     Findings: Abscess present.  Neurological:     General: No focal deficit present.     Mental Status: She is alert and oriented to person, place, and time. Mental status is at baseline.  Psychiatric:        Mood and Affect: Mood normal.    ED Results / Procedures / Treatments   Labs (all labs ordered are listed, but only abnormal results are displayed) Labs Reviewed - No data to display  EKG None  Radiology No results found.  Procedures Ultrasound ED Soft Tissue  Date/Time: 02/07/2021 4:36 PM Performed by: Paris Lore, PA-C Authorized by: Paris Lore, PA-C   Procedure details:    Indications: localization of abscess     Transverse view:  Visualized   Longitudinal view:  Visualized   Images: not archived   Location:    Location: breast     Side:  Left (1.5 cm inferolaterally to the nipple) Findings:     cellulitis present Comments:     Scant amount of residual pus, no significant abscess requiring I&D   Medications Ordered in ED Medications - No data to display  ED Course  I have reviewed the triage vital signs and the nursing  notes.  Pertinent labs & imaging results that were available during my care of the patient were reviewed by me and considered in my medical decision making (see chart for details).  MDM Rules/Calculators/A&P                         30 year old female presents with concern for episode of purulent and bloody drainage from her left breast this afternoon.  No associated pain or systemic symptoms.  Differential diagnose includes but is limited to mastitis, breast cellulitis, breast abscess, malignancy.  Mildly tachycardic on intake.  Cardiopulm exam is normal, abdominal exam is benign.  Breast exam as above with small opening suggesting pustule that has drained with surrounding erythema without induration.  Bedside ultrasound confirms cellulitis with some cobblestoning but otherwise was unremarkable, no significant abscess amenable to drainage.  Recommend warm compresses and will prescribe oral antibiotics in the outpatient setting.  Recommend close follow-up.  No further work-up warranted in ED as time.  Lonita voiced understanding of her medical evaluation and treatment plan.  Each of her questions was answered to her expressed satisfaction.  Return precautions given. Patient is well-appearing, stable, and appropriate for discharge at this time.  This chart was dictated using voice recognition software, Dragon. Despite the best efforts of this provider to proofread and correct errors, errors may still occur which can change documentation meaning.  Final Clinical Impression(s) / ED Diagnoses Final diagnoses:  Cellulitis of breast    Rx / DC Orders ED Discharge Orders          Ordered    cephALEXin (KEFLEX) 500 MG capsule  4 times daily        02/07/21 [address] [place] Gavia, PA-C 02/07/21 1639    Cathren Laine, MD 02/07/21 [phone]

## 2021-02-07 NOTE — ED Provider Notes (Signed)
Emergency Medicine Provider Triage Evaluation Note  Cynthia Caldwell , a 30 y.o. female  was evaluated in triage.  Pt complains of an abscess over the left breast.  States she was in the shower when she noticed a lump and proceeded to squeeze it and it drained yellow and bloody material.  She denies any fever or chills.  Has not had a history of anything similar.  She denies any family history of breast cancer and other gynecological cancers.  Review of Systems  Positive:  Negative: See above  Physical Exam  BP 139/81 (BP Location: Left Arm)   Pulse (!) 108   Temp 98.5 F (36.9 C) (Oral)   Resp 14   LMP 02/05/2021   SpO2 100%  Gen:   Awake, no distress   Resp:  Normal effort  MSK:   Moves extremities without difficulty  Other:  Open wound was approximately less than 1 cm on the left breast.  Not actively bleeding  Medical Decision Making  Medically screening exam initiated at 12:35 PM.  Appropriate orders placed.  Cynthia Caldwell was informed that the remainder of the evaluation will be completed by another provider, this initial triage assessment does not replace that evaluation, and the importance of remaining in the ED until their evaluation is complete.     Honor Loh [place], PA-C 02/07/21 1237    Derwood Kaplan, MD 02/08/21 [phone]

## 2021-02-07 NOTE — ED Triage Notes (Signed)
Pt reports abscess that is now draining to L lower breast that she just noticed today.  Denies pain, chills, and fever.

## 2021-02-07 NOTE — Discharge Instructions (Addendum)
You were seen in the ER today for your breast drainage. You were diagnosed with cellulitis of your breast, which is a skin infection. You were prescribed antibiotics to take for the next 5 days. Please take them as prescribed for the entire course.   Follow-up with the clinic listed below and return to the ER with any persistent bleeding or drainage from the area, increased swelling, pain, fevers, chills, or any other new severe symptom.
